# Patient Record
Sex: Male | Born: 1990 | Race: Asian | Hispanic: No | Marital: Single | Smoking: Former smoker
Health system: Southern US, Community
[De-identification: ages and names within clinical notes are randomized; demographics above are authoritative.]

## PROBLEM LIST (undated history)

## (undated) DIAGNOSIS — M26609 Unspecified temporomandibular joint disorder, unspecified side: Secondary | ICD-10-CM

## (undated) DIAGNOSIS — S43491A Other sprain of right shoulder joint, initial encounter: Secondary | ICD-10-CM

## (undated) HISTORY — PX: CHEST WALL BIOPSY: SHX1338

---

## 2013-02-04 ENCOUNTER — Other Ambulatory Visit: Payer: Self-pay | Admitting: Family Medicine

## 2013-02-04 DIAGNOSIS — M25511 Pain in right shoulder: Secondary | ICD-10-CM

## 2013-02-16 ENCOUNTER — Ambulatory Visit
Admission: RE | Admit: 2013-02-16 | Discharge: 2013-02-16 | Disposition: A | Discharge status: Home / Self Care | Payer: Federal, State, Local not specified - PPO | Source: Ambulatory Visit | Attending: Family Medicine | Admitting: Family Medicine

## 2013-02-16 DIAGNOSIS — M25511 Pain in right shoulder: Secondary | ICD-10-CM

## 2013-02-16 MED ORDER — IOHEXOL 180 MG/ML  SOLN
13.0000 mL | Freq: Once | INTRAMUSCULAR | Status: AC | PRN
  Administered 2013-02-16: 13 mL via INTRA_ARTICULAR

## 2013-04-19 ENCOUNTER — Other Ambulatory Visit: Payer: Self-pay | Admitting: Orthopedic Surgery

## 2013-05-16 ENCOUNTER — Encounter (HOSPITAL_BASED_OUTPATIENT_CLINIC_OR_DEPARTMENT_OTHER): Payer: Self-pay | Admitting: *Deleted

## 2013-05-16 DIAGNOSIS — S43431A Superior glenoid labrum lesion of right shoulder, initial encounter: Secondary | ICD-10-CM

## 2013-05-16 DIAGNOSIS — S43491A Other sprain of right shoulder joint, initial encounter: Secondary | ICD-10-CM

## 2013-05-16 HISTORY — DX: Other sprain of right shoulder joint, initial encounter: S43.491A

## 2013-05-16 HISTORY — DX: Superior glenoid labrum lesion of right shoulder, initial encounter: S43.431A

## 2013-05-23 ENCOUNTER — Encounter (HOSPITAL_BASED_OUTPATIENT_CLINIC_OR_DEPARTMENT_OTHER): Payer: Federal, State, Local not specified - PPO | Admitting: Anesthesiology

## 2013-05-23 ENCOUNTER — Ambulatory Visit (HOSPITAL_BASED_OUTPATIENT_CLINIC_OR_DEPARTMENT_OTHER): Payer: Federal, State, Local not specified - PPO | Admitting: Anesthesiology

## 2013-05-23 ENCOUNTER — Encounter (HOSPITAL_BASED_OUTPATIENT_CLINIC_OR_DEPARTMENT_OTHER): Payer: Self-pay | Admitting: Anesthesiology

## 2013-05-23 ENCOUNTER — Encounter (HOSPITAL_BASED_OUTPATIENT_CLINIC_OR_DEPARTMENT_OTHER)
Admission: RE | Disposition: A | Discharge status: Home / Self Care | Payer: Self-pay | Source: Ambulatory Visit | Attending: Orthopedic Surgery

## 2013-05-23 ENCOUNTER — Ambulatory Visit (HOSPITAL_BASED_OUTPATIENT_CLINIC_OR_DEPARTMENT_OTHER)
Admission: RE | Admit: 2013-05-23 | Discharge: 2013-05-23 | Disposition: A | Discharge status: Home / Self Care | Payer: Federal, State, Local not specified - PPO | Source: Ambulatory Visit | Attending: Orthopedic Surgery | Admitting: Orthopedic Surgery

## 2013-05-23 DIAGNOSIS — S43439A Superior glenoid labrum lesion of unspecified shoulder, initial encounter: Secondary | ICD-10-CM | POA: Insufficient documentation

## 2013-05-23 DIAGNOSIS — M24819 Other specific joint derangements of unspecified shoulder, not elsewhere classified: Secondary | ICD-10-CM | POA: Insufficient documentation

## 2013-05-23 DIAGNOSIS — X58XXXA Exposure to other specified factors, initial encounter: Secondary | ICD-10-CM | POA: Insufficient documentation

## 2013-05-23 DIAGNOSIS — Z881 Allergy status to other antibiotic agents status: Secondary | ICD-10-CM | POA: Insufficient documentation

## 2013-05-23 DIAGNOSIS — Z882 Allergy status to sulfonamides status: Secondary | ICD-10-CM | POA: Insufficient documentation

## 2013-05-23 DIAGNOSIS — S43431D Superior glenoid labrum lesion of right shoulder, subsequent encounter: Secondary | ICD-10-CM

## 2013-05-23 HISTORY — PX: BANKART REPAIR: SHX5173

## 2013-05-23 HISTORY — DX: Other sprain of right shoulder joint, initial encounter: S43.491A

## 2013-05-23 HISTORY — DX: Unspecified temporomandibular joint disorder, unspecified side: M26.609

## 2013-05-23 SURGERY — REPAIR, SHOULDER, ARTHROSCOPIC, BANKART
Anesthesia: General | Laterality: Right

## 2013-05-23 MED ORDER — FENTANYL CITRATE 0.05 MG/ML IJ SOLN
INTRAMUSCULAR | Status: DC | PRN
  Administered 2013-05-23: 100 ug via INTRAVENOUS

## 2013-05-23 MED ORDER — OXYCODONE-ACETAMINOPHEN 5-325 MG PO TABS: 1.0000 | ORAL_TABLET | ORAL | Status: DC | PRN

## 2013-05-23 MED ORDER — ONDANSETRON HCL 4 MG/2ML IJ SOLN
INTRAMUSCULAR | Status: DC | PRN
  Administered 2013-05-23: 4 mg via INTRAVENOUS

## 2013-05-23 MED ORDER — CLINDAMYCIN PHOSPHATE 900 MG/50ML IV SOLN
INTRAVENOUS | Status: DC | PRN
  Administered 2013-05-23: 900 mg via INTRAVENOUS

## 2013-05-23 MED ORDER — MIDAZOLAM HCL 5 MG/5ML IJ SOLN
INTRAMUSCULAR | Status: DC | PRN
  Administered 2013-05-23: 2 mg via INTRAVENOUS

## 2013-05-23 MED ORDER — CEFAZOLIN SODIUM-DEXTROSE 2-3 GM-% IV SOLR
INTRAVENOUS | Status: AC
  Filled 2013-05-23: qty 50

## 2013-05-23 MED ORDER — LACTATED RINGERS IV SOLN
INTRAVENOUS | Status: DC
  Administered 2013-05-23 (×2): via INTRAVENOUS

## 2013-05-23 MED ORDER — MIDAZOLAM HCL 2 MG/2ML IJ SOLN
INTRAMUSCULAR | Status: AC
  Filled 2013-05-23: qty 2

## 2013-05-23 MED ORDER — HYDROMORPHONE HCL PF 1 MG/ML IJ SOLN
INTRAMUSCULAR | Status: AC
  Filled 2013-05-23: qty 1

## 2013-05-23 MED ORDER — FENTANYL CITRATE 0.05 MG/ML IJ SOLN
INTRAMUSCULAR | Status: AC
  Filled 2013-05-23: qty 2

## 2013-05-23 MED ORDER — HYDROMORPHONE HCL PF 1 MG/ML IJ SOLN
0.2500 mg | INTRAMUSCULAR | Status: DC | PRN
  Administered 2013-05-23 (×2): 0.5 mg via INTRAVENOUS

## 2013-05-23 MED ORDER — CLINDAMYCIN PHOSPHATE 900 MG/50ML IV SOLN
INTRAVENOUS | Status: AC
  Filled 2013-05-23: qty 50

## 2013-05-23 MED ORDER — DEXAMETHASONE SODIUM PHOSPHATE 4 MG/ML IJ SOLN
INTRAMUSCULAR | Status: DC | PRN
  Administered 2013-05-23: 10 mg via INTRAVENOUS

## 2013-05-23 MED ORDER — LIDOCAINE HCL (CARDIAC) 20 MG/ML IV SOLN
INTRAVENOUS | Status: DC | PRN
  Administered 2013-05-23: 50 mg via INTRAVENOUS

## 2013-05-23 MED ORDER — PROPOFOL 10 MG/ML IV BOLUS
INTRAVENOUS | Status: DC | PRN
  Administered 2013-05-23: 200 mg via INTRAVENOUS

## 2013-05-23 MED ORDER — FENTANYL CITRATE 0.05 MG/ML IJ SOLN: 50.0000 ug | INTRAMUSCULAR | Status: DC | PRN

## 2013-05-23 MED ORDER — CEFAZOLIN SODIUM-DEXTROSE 2-3 GM-% IV SOLR: 2.0000 g | INTRAVENOUS | Status: DC

## 2013-05-23 MED ORDER — OXYCODONE HCL 5 MG PO TABS: 5.0000 mg | ORAL_TABLET | Freq: Once | ORAL | Status: DC | PRN

## 2013-05-23 MED ORDER — OXYCODONE HCL 5 MG/5ML PO SOLN: 5.0000 mg | Freq: Once | ORAL | Status: DC | PRN

## 2013-05-23 MED ORDER — SODIUM CHLORIDE 0.9 % IR SOLN
Status: DC | PRN
  Administered 2013-05-23: 3000 mL

## 2013-05-23 MED ORDER — FENTANYL CITRATE 0.05 MG/ML IJ SOLN
INTRAMUSCULAR | Status: AC
  Filled 2013-05-23: qty 4

## 2013-05-23 MED ORDER — MIDAZOLAM HCL 2 MG/2ML IJ SOLN: 1.0000 mg | INTRAMUSCULAR | Status: DC | PRN

## 2013-05-23 MED ORDER — MORPHINE SULFATE 10 MG/ML IJ SOLN
INTRAMUSCULAR | Status: DC | PRN
  Administered 2013-05-23 (×2): 2 mg via INTRAVENOUS
  Administered 2013-05-23: 3 mg via INTRAVENOUS

## 2013-05-23 MED ORDER — POVIDONE-IODINE 7.5 % EX SOLN: Freq: Once | CUTANEOUS | Status: DC

## 2013-05-23 MED ORDER — SUCCINYLCHOLINE CHLORIDE 20 MG/ML IJ SOLN
INTRAMUSCULAR | Status: DC | PRN
  Administered 2013-05-23: 100 mg via INTRAVENOUS

## 2013-05-23 MED ORDER — MIDAZOLAM HCL 2 MG/ML PO SYRP: 12.0000 mg | ORAL_SOLUTION | Freq: Once | ORAL | Status: DC | PRN

## 2013-05-23 MED ORDER — DOCUSATE SODIUM 100 MG PO CAPS: 100.0000 mg | ORAL_CAPSULE | Freq: Three times a day (TID) | ORAL | Status: DC | PRN

## 2013-05-23 MED ORDER — MORPHINE SULFATE 10 MG/ML IJ SOLN
INTRAMUSCULAR | Status: AC
  Filled 2013-05-23: qty 1

## 2013-05-23 SURGICAL SUPPLY — 83 items
ANCHOR SUT BIOCOMP LK 2.9X12.5 (Anchor) ×4 IMPLANT
BENZOIN TINCTURE PRP APPL 2/3 (GAUZE/BANDAGES/DRESSINGS) IMPLANT
BLADE SURG 15 STRL LF DISP TIS (BLADE) IMPLANT
BLADE SURG 15 STRL SS (BLADE)
BLADE SURG ROTATE 9660 (MISCELLANEOUS) IMPLANT
BLADE VORTEX 6.0 (BLADE) IMPLANT
BUR 3.5 LG SPHERICAL (BURR) IMPLANT
BUR OVAL 4.0 (BURR) IMPLANT
BURR 3.5 LG SPHERICAL (BURR)
CANISTER SUCT 3000ML (MISCELLANEOUS) IMPLANT
CANISTER SUCT LVC 12 LTR MEDI- (MISCELLANEOUS) ×2 IMPLANT
CANNULA 5.75X71 LONG (CANNULA) ×2 IMPLANT
CANNULA TWIST IN 8.25X7CM (CANNULA) ×2 IMPLANT
CHLORAPREP W/TINT 26ML (MISCELLANEOUS) ×2 IMPLANT
DECANTER SPIKE VIAL GLASS SM (MISCELLANEOUS) IMPLANT
DERMABOND ADVANCED (GAUZE/BANDAGES/DRESSINGS)
DERMABOND ADVANCED .7 DNX12 (GAUZE/BANDAGES/DRESSINGS) IMPLANT
DRAPE INCISE IOBAN 66X45 STRL (DRAPES) ×2 IMPLANT
DRAPE STERI 35X30 U-POUCH (DRAPES) ×2 IMPLANT
DRAPE SURG 17X23 STRL (DRAPES) ×2 IMPLANT
DRAPE U 20/CS (DRAPES) ×2 IMPLANT
DRAPE U-SHAPE 47X51 STRL (DRAPES) ×2 IMPLANT
DRAPE U-SHAPE 76X120 STRL (DRAPES) ×4 IMPLANT
ELECT REM PT RETURN 9FT ADLT (ELECTROSURGICAL)
ELECTRODE REM PT RTRN 9FT ADLT (ELECTROSURGICAL) IMPLANT
GAUZE SPONGE 4X4 16PLY XRAY LF (GAUZE/BANDAGES/DRESSINGS) IMPLANT
GAUZE XEROFORM 1X8 LF (GAUZE/BANDAGES/DRESSINGS) ×2 IMPLANT
GLOVE BIO SURGEON STRL SZ7 (GLOVE) ×2 IMPLANT
GLOVE BIO SURGEON STRL SZ7.5 (GLOVE) ×4 IMPLANT
GLOVE BIOGEL PI IND STRL 7.0 (GLOVE) ×2 IMPLANT
GLOVE BIOGEL PI IND STRL 8 (GLOVE) ×2 IMPLANT
GLOVE BIOGEL PI INDICATOR 7.0 (GLOVE) ×2
GLOVE BIOGEL PI INDICATOR 8 (GLOVE) ×2
GLOVE ECLIPSE 6.5 STRL STRAW (GLOVE) ×2 IMPLANT
GOWN PREVENTION PLUS XLARGE (GOWN DISPOSABLE) ×2 IMPLANT
KIT PUSHLOCK 2.9 HIP (KITS) ×2 IMPLANT
LASSO 90 CVE QUICKPAS (DISPOSABLE) ×2 IMPLANT
NDL SUT 6 .5 CRC .975X.05 MAYO (NEEDLE) IMPLANT
NEEDLE 1/2 CIR CATGUT .05X1.09 (NEEDLE) IMPLANT
NEEDLE MAYO TAPER (NEEDLE)
NS IRRIG 1000ML POUR BTL (IV SOLUTION) IMPLANT
PACK ARTHROSCOPY DSU (CUSTOM PROCEDURE TRAY) ×2 IMPLANT
PACK BASIN DAY SURGERY FS (CUSTOM PROCEDURE TRAY) ×2 IMPLANT
PAD ABD 8X10 STRL (GAUZE/BANDAGES/DRESSINGS) ×2 IMPLANT
PENCIL BUTTON HOLSTER BLD 10FT (ELECTRODE) IMPLANT
RESECTOR FULL RADIUS 4.2MM (BLADE) ×2 IMPLANT
SHEET MEDIUM DRAPE 40X70 STRL (DRAPES) IMPLANT
SLEEVE SCD COMPRESS KNEE MED (MISCELLANEOUS) ×2 IMPLANT
SLING ARM FOAM STRAP LRG (SOFTGOODS) IMPLANT
SLING ARM FOAM STRAP MED (SOFTGOODS) IMPLANT
SLING ARM FOAM STRAP XLG (SOFTGOODS) IMPLANT
SLING ARM IMMOBILIZER LRG (SOFTGOODS) ×2 IMPLANT
SLING ARM IMMOBILIZER MED (SOFTGOODS) IMPLANT
SPONGE GAUZE 4X4 12PLY (GAUZE/BANDAGES/DRESSINGS) ×2 IMPLANT
SPONGE LAP 4X18 X RAY DECT (DISPOSABLE) IMPLANT
STRIP CLOSURE SKIN 1/2X4 (GAUZE/BANDAGES/DRESSINGS) IMPLANT
SUCTION FRAZIER TIP 10 FR DISP (SUCTIONS) IMPLANT
SUPPORT WRAP ARM LG (MISCELLANEOUS) IMPLANT
SUT BONE WAX W31G (SUTURE) IMPLANT
SUT ETHIBOND 2 OS 4 DA (SUTURE) IMPLANT
SUT ETHILON 3 0 PS 1 (SUTURE) ×2 IMPLANT
SUT ETHILON 4 0 PS 2 18 (SUTURE) IMPLANT
SUT FIBERWIRE #2 38 T-5 BLUE (SUTURE)
SUT FIBERWIRE 2-0 18 17.9 3/8 (SUTURE)
SUT MNCRL AB 3-0 PS2 18 (SUTURE) IMPLANT
SUT MNCRL AB 4-0 PS2 18 (SUTURE) IMPLANT
SUT PDS AB 0 CT 36 (SUTURE) IMPLANT
SUT PROLENE 3 0 PS 2 (SUTURE) IMPLANT
SUT VIC AB 0 CT1 27 (SUTURE)
SUT VIC AB 0 CT1 27XBRD ANBCTR (SUTURE) IMPLANT
SUT VIC AB 2-0 SH 27 (SUTURE)
SUT VIC AB 2-0 SH 27XBRD (SUTURE) IMPLANT
SUTURE FIBERWR #2 38 T-5 BLUE (SUTURE) IMPLANT
SUTURE FIBERWR 2-0 18 17.9 3/8 (SUTURE) IMPLANT
SYR BULB 3OZ (MISCELLANEOUS) IMPLANT
TAPE LABRALWHITE 1.5X36 (TAPE) ×4 IMPLANT
TOWEL OR 17X24 6PK STRL BLUE (TOWEL DISPOSABLE) ×2 IMPLANT
TOWEL OR NON WOVEN STRL DISP B (DISPOSABLE) ×2 IMPLANT
TUBE CONNECTING 20X1/4 (TUBING) ×2 IMPLANT
TUBING ARTHROSCOPY IRRIG 16FT (MISCELLANEOUS) ×2 IMPLANT
WAND STAR VAC 90 (SURGICAL WAND) IMPLANT
WATER STERILE IRR 1000ML POUR (IV SOLUTION) ×2 IMPLANT
YANKAUER SUCT BULB TIP NO VENT (SUCTIONS) IMPLANT

## 2013-05-23 NOTE — Anesthesia Procedure Notes (Signed)
Procedure Name: Intubation Date/Time: 05/23/2013 11:53 AM Performed by: Caren Macadam Pre-anesthesia Checklist: Patient identified, Emergency Drugs available, Suction available and Patient being monitored Patient Re-evaluated:Patient Re-evaluated prior to inductionOxygen Delivery Method: Circle System Utilized Preoxygenation: Pre-oxygenation with 100% oxygen Intubation Type: IV induction Ventilation: Mask ventilation without difficulty Laryngoscope Size: Miller and 2 Grade View: Grade I Tube type: Oral Tube size: 8.0 mm Number of attempts: 1 Airway Equipment and Method: stylet and oral airway Placement Confirmation: ETT inserted through vocal cords under direct vision,  positive ETCO2 and breath sounds checked- equal and bilateral Secured at: 22 cm Tube secured with: Tape Dental Injury: Teeth and Oropharynx as per pre-operative assessment

## 2013-05-23 NOTE — H&P (Signed)
Adam Curtis is an 22 y.o. male.   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 Clicking jaw syndrome     Past Surgical History  Procedure Laterality Date  . Chest wall biopsy      as a child    History reviewed. No pertinent family history. Social History:  reports that he has never smoked. He has never used smokeless tobacco. He reports that he does not drink alcohol or use illicit drugs.  Allergies:  Allergies  Allergen Reactions  . Amoxicillin Other (See Comments)    UNKNOWN - WAS AS A CHILD  . Sulfa Antibiotics Other (See Comments)    UNKNOWN - WAS AS A CHILD    Medications Prior to Admission  Medication Sig Dispense Refill  . cetirizine (ZYRTEC) 10 MG tablet Take 10 mg by mouth daily.        Results for orders placed during the hospital encounter of 05/23/13 (from the past 48 hour(s))  POCT HEMOGLOBIN-HEMACUE     Status: None   Collection Time    05/23/13 10:38 AM      Result Value Range   Hemoglobin 15.4  13.0 - 17.0 g/dL   No results found.  Review of Systems  All other systems reviewed and are negative.    Blood pressure 148/87, pulse 65, temperature 97.9 F (36.6 C), temperature source Oral, resp. rate 18, height 6\' 1"  (1.854 m), weight 88.905 kg (196 lb), SpO2 65.00%. Physical Exam  Constitutional: He is oriented to person, place, and time. He appears well-developed and well-nourished.  HENT:  Head: Atraumatic.  Eyes: EOM are normal.  Cardiovascular: Intact distal pulses.   Respiratory: Effort normal.  Musculoskeletal:       Right shoulder: He exhibits normal strength.  R shoulder positive anterior apprehension.  Neurological: He is alert and oriented to person, place, and time.  Skin: Skin is warm and dry.  Psychiatric: He has a normal mood and affect.      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

## 2013-05-23 NOTE — Anesthesia Postprocedure Evaluation (Signed)
  Anesthesia Post-op Note  Patient: Adam Curtis  Procedure(s) Performed: Procedure(s) with comments: ARTHROSCOPIC BANKART REPAIR (Right) - Rigth shoulder bankart repair with debridement of chondral injury  Patient Location: PACU  Anesthesia Type:General  Level of Consciousness: awake and alert   Airway and Oxygen Therapy: Patient Spontanous Breathing  Post-op Pain: mild  Post-op Assessment: Post-op Vital signs reviewed, Patient's Cardiovascular Status Stable and Respiratory Function Stable  Post-op Vital Signs: Reviewed  Filed Vitals:   05/23/13 1345  BP:   Pulse: 66  Temp:   Resp: 15    Complications: No apparent anesthesia complications

## 2013-05-23 NOTE — Op Note (Signed)
Procedure(s): ARTHROSCOPIC BANKART REPAIR Procedure Note  Adam Curtis male 22 y.o. 05/23/2013  Procedure(s) and Anesthesia Type:    * Right shoulder ARTHROSCOPIC BANKART REPAIR - General  Surgeon(s) and Role:    * Mable Paris, MD - Primary     Surgeon: Mable Paris   Assistants: Damita Lack PA-C [facility] was present and scrubbed throughout the procedure and was essential in positioning, assisting with the camera and instrumentation,, and closure)  Anesthesia: General endotracheal anesthesia  ASA Class: 1    Procedure Detail  Right shoulder ARTHROSCOPIC BANKART REPAIR  Estimated Blood Loss: Min         Drains: none  Blood Given: none         Specimens: none        Complications:  * No complications entered in OR log *         Disposition: PACU - hemodynamically stable.         Condition: stable    Procedure:   INDICATIONS FOR SURGERY: The patient is 22 y.o. 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 He has had multiple subluxation/dislocation event and has had difficulty with daily activities secondary to his continued instability. He had an MRI arthrogram which revealed anterior-inferior labral tear. Ultimately he was indicated for surgical treatment to restore stability to the shoulder.  OPERATIVE FINDINGS: Examination under anesthesia: 2+ anterior instability. No posterior or sulcus. No limitation of motion. Diagnostic Arthroscopy:  Glenoid articular cartilage: The glenoid labrum was torn from about the 3:00 to 6:00 position anteriorly. This completely detached from the glenoid and inferiorly and medially displaced. There is some adjacent elevation of the cartilage which was debrided. No significant cartilage or bone loss. Humeral head articular cartilage: He had a small area of Hill-Sachs impaction injury posteriorly. Bursal sided rotator cuff: Intact   DESCRIPTION OF PROCEDURE: The patient was identified in  preoperative  holding area where I personally marked the operative site after  verifying site, side, and procedure with the patient. An interscalene block was given by the attending anesthesiologist the holding area.  The patient was taken back to the operating room where general anesthesia was induced without complication and was placed in the beach-chair position with the back  elevated about 60 degrees and all extremities and head and neck carefully padded and  positioned.   The right upper extremity was then prepped and  draped in a standard sterile fashion. The appropriate time-out  procedure was carried out. The patient did receive IV antibiotics  within 30 minutes of incision.   A small posterior portal incision was made and the arthroscope was introduced into the joint. An anterior portal was then established above the subscapularis using needle localization. Large cannula was placed anteriorly. Diagnostic arthroscopy was then carried out with findings as described above.  The posterior superior labrum intact. Rotator cuff was intact. No loose bodies were noted. No glenoid bone loss was noted. The anterior labrum was torn from the 3:00 to 6:00 position completely detached from the glenoid.  Needle localization was used to establish a high lateral rotator interval portal and the camera was moved to this position. Small cannula was then placed posteriorly for suture management. Viewing from the anterior lateral portal an arthroscopic elevator was used to free up scar tissue and mobilize the torn labrum 3:00 to 6:00 position. A rasp was used on the glenoid and labral side to promote healing. Once it was adequately mobilized a 90 hook suture passer was used  to pass a labral tape around the labrum and inferior glenohumeral ligament inferiorly. This was then brought to a 2.9 mm push lock anchor and advanced laterally and superiorly on the glenoid. This restored the glenoid labrum inferiorly and  restore the appropriate tension of the inferior glenohumeral ligament. An additional labral tape was passed superior to the first and again advanced laterally and superiorly to repair the remainder of the tear back to the anterior glenoid with another push lock anchor. Viewing from anterolateral and posterior portals this was felt to completely and adequately repair the labral tear back to its anatomic position.  The arthroscopic equipment was removed from the joint and the portals were closed with 3-0 nylon in an interrupted fashion. Sterile dressings were then applied including Xeroform 4 x 4's ABDs and tape. The patient was then allowed to awaken from general anesthesia, placed in a sling, transferred to the stretcher and taken to the recovery room in stable condition.   POSTOPERATIVE PLAN: The patient will be discharged home today and will followup in one week for suture removal and wound check.  He will follow the standard labral repair protocol.

## 2013-05-23 NOTE — Anesthesia Preprocedure Evaluation (Addendum)
Anesthesia Evaluation  Patient identified by MRN, date of birth, ID band Patient awake    Reviewed: Allergy & Precautions, H&P , NPO status , Patient's Chart, lab work & pertinent test results  Airway Mallampati: I TM Distance: >3 FB Neck ROM: Full    Dental no notable dental hx. (+) Teeth Intact and Dental Advisory Given   Pulmonary neg pulmonary ROS,  breath sounds clear to auscultation  Pulmonary exam normal       Cardiovascular negative cardio ROS  Rhythm:Regular Rate:Normal     Neuro/Psych negative neurological ROS  negative psych ROS   GI/Hepatic negative GI ROS, Neg liver ROS,   Endo/Other  negative endocrine ROS  Renal/GU negative Renal ROS  negative genitourinary   Musculoskeletal   Abdominal   Peds  Hematology negative hematology ROS (+)   Anesthesia Other Findings   Reproductive/Obstetrics negative OB ROS                          Anesthesia Physical Anesthesia Plan  ASA: I  Anesthesia Plan: General   Post-op Pain Management:    Induction: Intravenous  Airway Management Planned: Oral ETT  Additional Equipment:   Intra-op Plan:   Post-operative Plan: Extubation in OR  Informed Consent: I have reviewed the patients History and Physical, chart, labs and discussed the procedure including the risks, benefits and alternatives for the proposed anesthesia with the patient or authorized representative who has indicated his/her understanding and acceptance.   Dental advisory given  Plan Discussed with: CRNA and Surgeon  Anesthesia Plan Comments: (Pt. declines block.)       Anesthesia Quick Evaluation

## 2013-05-23 NOTE — Transfer of Care (Signed)
Immediate Anesthesia Transfer of Care Note  Patient: Adam Curtis  Procedure(s) Performed: Procedure(s) with comments: ARTHROSCOPIC BANKART REPAIR (Right) - Rigth shoulder bankart repair with debridement of chondral injury  Patient Location: PACU  Anesthesia Type:General  Level of Consciousness: awake  Airway & Oxygen Therapy: Patient Spontanous Breathing and Patient connected to face mask oxygen  Post-op Assessment: Report given to PACU RN and Post -op Vital signs reviewed and stable  Post vital signs: Reviewed and stable  Complications: No apparent anesthesia complications

## 2013-05-25 ENCOUNTER — Encounter (HOSPITAL_BASED_OUTPATIENT_CLINIC_OR_DEPARTMENT_OTHER): Payer: Self-pay | Admitting: Orthopedic Surgery

## 2014-05-12 IMAGING — RF DG FLUORO GUIDE NDL PLC/BX
3 series · 3 of 3 positions shown · IV contrast (multihance)
Comparison: none

CLINICAL DATA: Right shoulder pain

RIGHT SHOULDER INJECTION UNDER FLUOROSCOPY
TECHNIQUE: An appropriate skin entrance site was determined.  The
site was marked, prepped with Betadine, draped in the usual sterile
fashion, and infiltrated locally with buffered Lidocaine.  22 gauge
spinal needle was advanced to the superomedial margin of the
humeral head under intermittent fluoroscopy.  1 ml of Lidocaine
injected easily.  A mixture of 0.05 ml Multihance 10 ml of
Omnipaque 300 was then used to opacify the right shoulder capsule.
No immediate complication.
Fluoroscopy Time: 39 seconds.

[Series 2: (hospital) · 1 of 1 slices shown (1 of 3)]
[im 1/1]
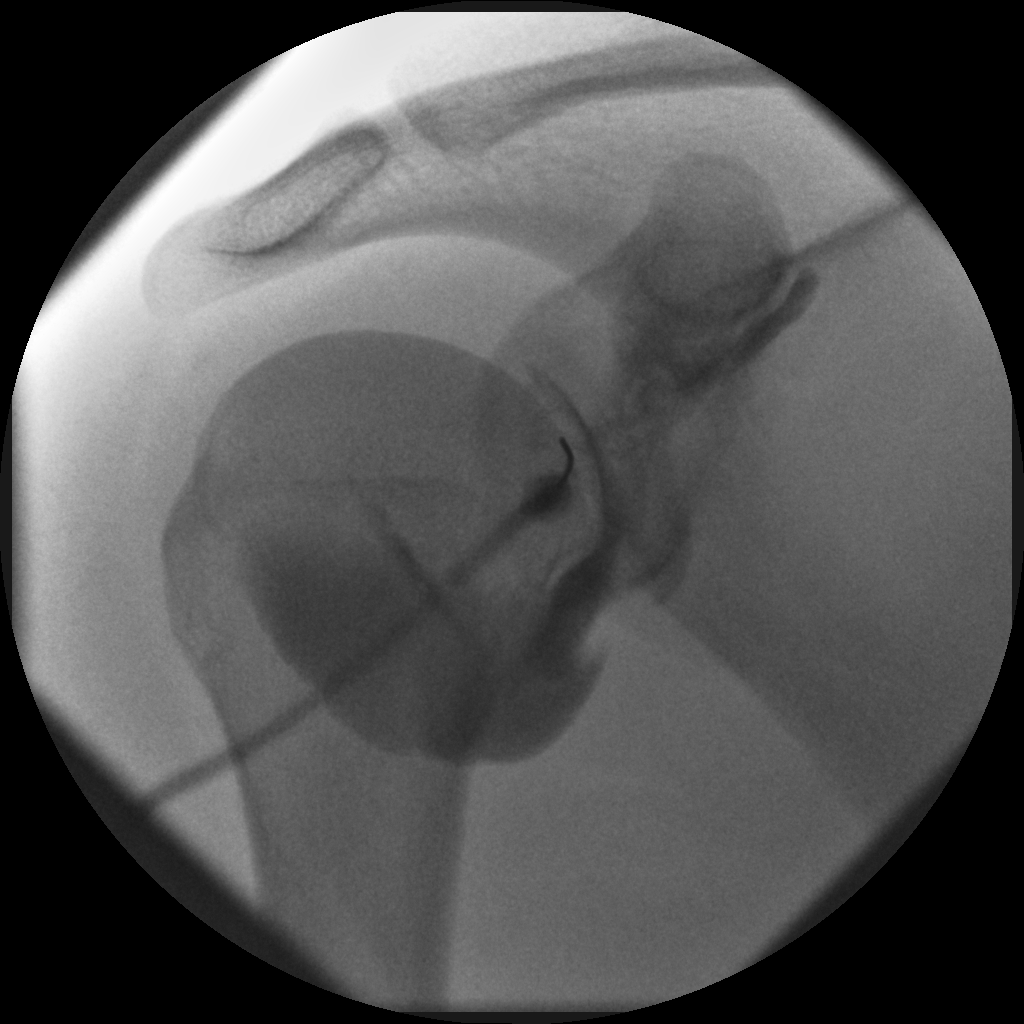

[Series 3: (hospital) · 1 of 1 slices shown (2 of 3)]
[im 1/1]
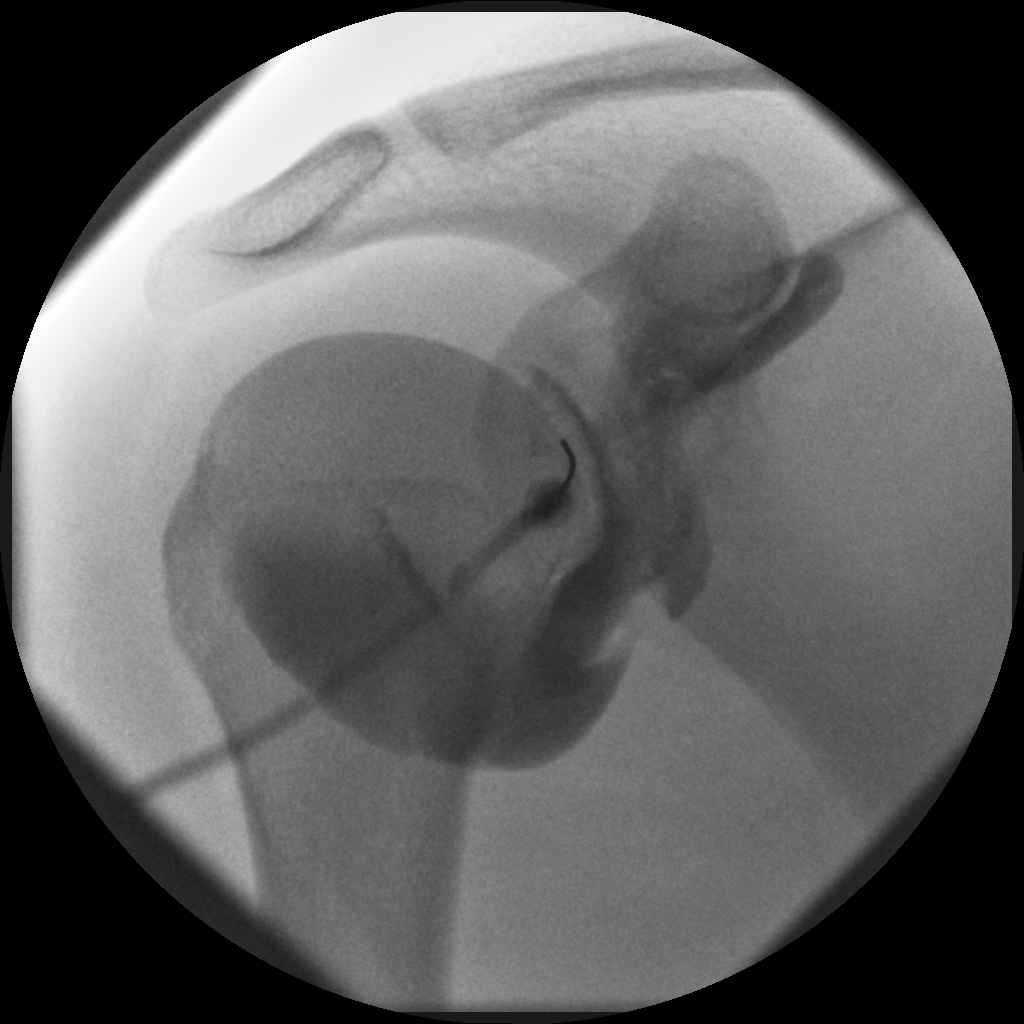

[Series 4: (hospital) · 1 of 1 slices shown (3 of 3)]
[im 1/1]
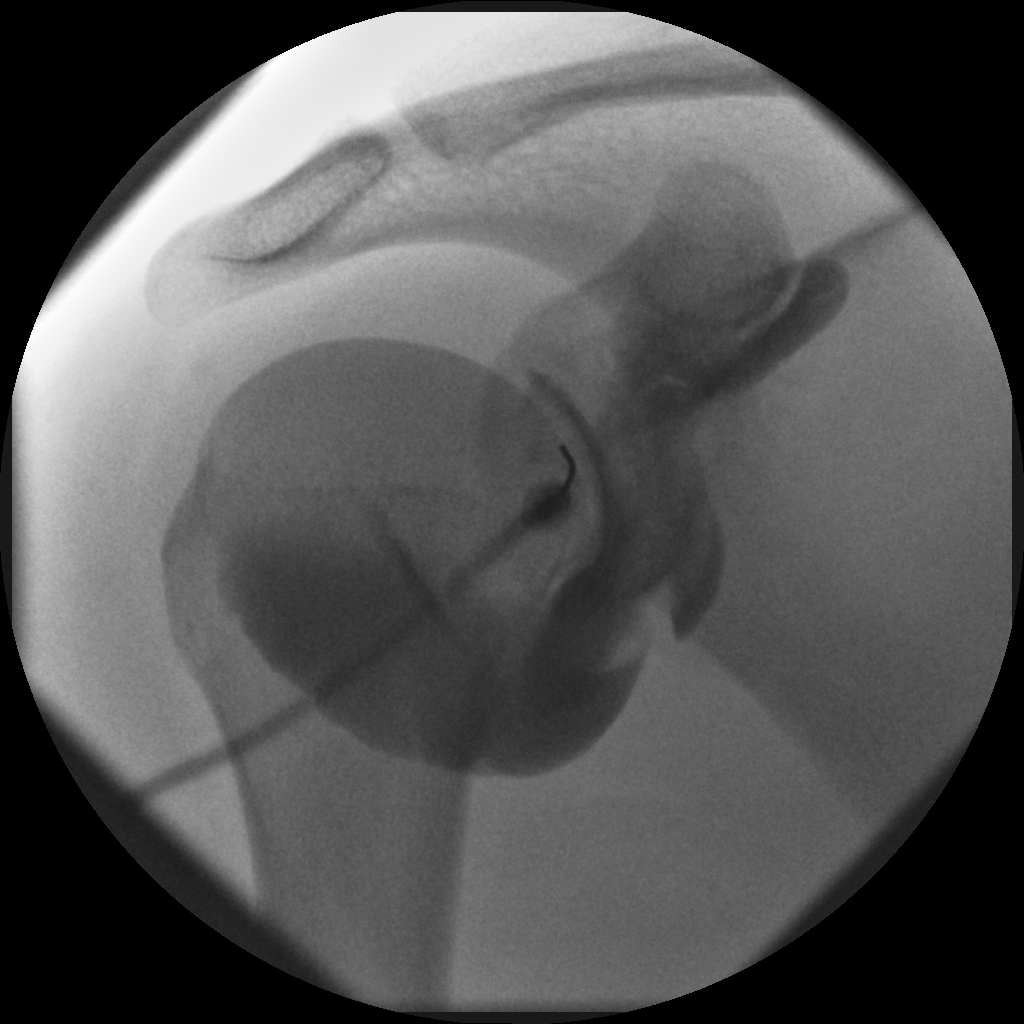

[3 of 3 positions shown; findings below may reference images not displayed]

IMPRESSION: Technically successful right shoulder injection for MRI.

## 2015-10-30 ENCOUNTER — Other Ambulatory Visit: Payer: Self-pay | Admitting: Orthopedic Surgery

## 2015-10-31 ENCOUNTER — Encounter (HOSPITAL_BASED_OUTPATIENT_CLINIC_OR_DEPARTMENT_OTHER): Payer: Self-pay | Admitting: *Deleted

## 2015-11-05 ENCOUNTER — Ambulatory Visit (HOSPITAL_BASED_OUTPATIENT_CLINIC_OR_DEPARTMENT_OTHER)
Admission: RE | Admit: 2015-11-05 | Discharge: 2015-11-05 | Disposition: A | Discharge status: Home / Self Care | Payer: Federal, State, Local not specified - PPO | Source: Ambulatory Visit | Attending: Orthopedic Surgery | Admitting: Orthopedic Surgery

## 2015-11-05 ENCOUNTER — Ambulatory Visit (HOSPITAL_BASED_OUTPATIENT_CLINIC_OR_DEPARTMENT_OTHER): Payer: Federal, State, Local not specified - PPO | Admitting: Anesthesiology

## 2015-11-05 ENCOUNTER — Encounter (HOSPITAL_BASED_OUTPATIENT_CLINIC_OR_DEPARTMENT_OTHER): Payer: Self-pay | Admitting: Anesthesiology

## 2015-11-05 ENCOUNTER — Encounter (HOSPITAL_BASED_OUTPATIENT_CLINIC_OR_DEPARTMENT_OTHER)
Admission: RE | Disposition: A | Discharge status: Home / Self Care | Payer: Self-pay | Source: Ambulatory Visit | Attending: Orthopedic Surgery

## 2015-11-05 DIAGNOSIS — M278 Other specified diseases of jaws: Secondary | ICD-10-CM | POA: Diagnosis not present

## 2015-11-05 DIAGNOSIS — M25312 Other instability, left shoulder: Secondary | ICD-10-CM | POA: Diagnosis not present

## 2015-11-05 DIAGNOSIS — M25512 Pain in left shoulder: Secondary | ICD-10-CM | POA: Diagnosis present

## 2015-11-05 DIAGNOSIS — Z79899 Other long term (current) drug therapy: Secondary | ICD-10-CM | POA: Insufficient documentation

## 2015-11-05 DIAGNOSIS — M24012 Loose body in left shoulder: Secondary | ICD-10-CM | POA: Insufficient documentation

## 2015-11-05 DIAGNOSIS — Z880 Allergy status to penicillin: Secondary | ICD-10-CM | POA: Diagnosis not present

## 2015-11-05 DIAGNOSIS — Z882 Allergy status to sulfonamides status: Secondary | ICD-10-CM | POA: Diagnosis not present

## 2015-11-05 DIAGNOSIS — Z87891 Personal history of nicotine dependence: Secondary | ICD-10-CM | POA: Diagnosis not present

## 2015-11-05 HISTORY — PX: SHOULDER ARTHROSCOPY WITH BANKART REPAIR: SHX5673

## 2015-11-05 SURGERY — SHOULDER ARTHROSCOPY WITH BANKART REPAIR
Anesthesia: General | Site: Shoulder | Laterality: Left

## 2015-11-05 MED ORDER — GLYCOPYRROLATE 0.2 MG/ML IJ SOLN: 0.2000 mg | Freq: Once | INTRAMUSCULAR | Status: DC | PRN

## 2015-11-05 MED ORDER — DEXAMETHASONE SODIUM PHOSPHATE 4 MG/ML IJ SOLN
INTRAMUSCULAR | Status: DC | PRN
  Administered 2015-11-05: 10 mg via INTRAVENOUS

## 2015-11-05 MED ORDER — DOCUSATE SODIUM 100 MG PO CAPS: 100.0000 mg | ORAL_CAPSULE | Freq: Three times a day (TID) | ORAL | Status: AC | PRN

## 2015-11-05 MED ORDER — BUPIVACAINE HCL (PF) 0.25 % IJ SOLN
INTRAMUSCULAR | Status: AC
  Filled 2015-11-05: qty 30

## 2015-11-05 MED ORDER — MIDAZOLAM HCL 2 MG/2ML IJ SOLN
INTRAMUSCULAR | Status: AC
  Filled 2015-11-05: qty 2

## 2015-11-05 MED ORDER — BUPIVACAINE HCL (PF) 0.25 % IJ SOLN
INTRAMUSCULAR | Status: DC | PRN
  Administered 2015-11-05: 20 mL via INTRA_ARTICULAR

## 2015-11-05 MED ORDER — DEXAMETHASONE SODIUM PHOSPHATE 10 MG/ML IJ SOLN
INTRAMUSCULAR | Status: AC
  Filled 2015-11-05: qty 1

## 2015-11-05 MED ORDER — SUCCINYLCHOLINE CHLORIDE 20 MG/ML IJ SOLN
INTRAMUSCULAR | Status: DC | PRN
  Administered 2015-11-05: 100 mg via INTRAVENOUS

## 2015-11-05 MED ORDER — ONDANSETRON HCL 4 MG/2ML IJ SOLN
INTRAMUSCULAR | Status: AC
  Filled 2015-11-05: qty 2

## 2015-11-05 MED ORDER — POVIDONE-IODINE 7.5 % EX SOLN: Freq: Once | CUTANEOUS | Status: DC

## 2015-11-05 MED ORDER — ACETAMINOPHEN 500 MG PO TABS
1000.0000 mg | ORAL_TABLET | Freq: Once | ORAL | Status: AC
  Administered 2015-11-05: 1000 mg via ORAL

## 2015-11-05 MED ORDER — SCOPOLAMINE 1 MG/3DAYS TD PT72: 1.0000 | MEDICATED_PATCH | Freq: Once | TRANSDERMAL | Status: DC | PRN

## 2015-11-05 MED ORDER — HYDROMORPHONE HCL 1 MG/ML IJ SOLN
INTRAMUSCULAR | Status: AC
  Filled 2015-11-05: qty 1

## 2015-11-05 MED ORDER — SODIUM CHLORIDE 0.9 % IR SOLN
Status: DC | PRN
Start: 2015-11-05 — End: 2015-11-05
  Administered 2015-11-05: 5500 mL

## 2015-11-05 MED ORDER — MIDAZOLAM HCL 2 MG/2ML IJ SOLN
1.0000 mg | INTRAMUSCULAR | Status: DC | PRN
  Administered 2015-11-05: 2 mg via INTRAVENOUS

## 2015-11-05 MED ORDER — ACETAMINOPHEN 500 MG PO TABS
ORAL_TABLET | ORAL | Status: AC
  Filled 2015-11-05: qty 2

## 2015-11-05 MED ORDER — LACTATED RINGERS IV SOLN
INTRAVENOUS | Status: DC
  Administered 2015-11-05 (×2): via INTRAVENOUS

## 2015-11-05 MED ORDER — OXYCODONE-ACETAMINOPHEN 5-325 MG PO TABS: 1.0000 | ORAL_TABLET | ORAL | Status: AC | PRN

## 2015-11-05 MED ORDER — FENTANYL CITRATE (PF) 100 MCG/2ML IJ SOLN
50.0000 ug | INTRAMUSCULAR | Status: AC | PRN
  Administered 2015-11-05: 25 ug via INTRAVENOUS
  Administered 2015-11-05: 50 ug via INTRAVENOUS
  Administered 2015-11-05: 25 ug via INTRAVENOUS
  Administered 2015-11-05: 100 ug via INTRAVENOUS

## 2015-11-05 MED ORDER — FENTANYL CITRATE (PF) 100 MCG/2ML IJ SOLN
INTRAMUSCULAR | Status: AC
  Filled 2015-11-05: qty 4

## 2015-11-05 MED ORDER — HYDROMORPHONE HCL 1 MG/ML IJ SOLN
0.2500 mg | INTRAMUSCULAR | Status: DC | PRN
  Administered 2015-11-05 (×2): 0.5 mg via INTRAVENOUS

## 2015-11-05 MED ORDER — KETOROLAC TROMETHAMINE 30 MG/ML IJ SOLN
INTRAMUSCULAR | Status: DC | PRN
  Administered 2015-11-05: 30 mg via INTRAVENOUS

## 2015-11-05 MED ORDER — PROPOFOL 10 MG/ML IV BOLUS
INTRAVENOUS | Status: DC | PRN
  Administered 2015-11-05: 60 mg via INTRAVENOUS
  Administered 2015-11-05: 200 mg via INTRAVENOUS

## 2015-11-05 MED ORDER — VANCOMYCIN HCL IN DEXTROSE 1-5 GM/200ML-% IV SOLN
INTRAVENOUS | Status: AC
  Filled 2015-11-05: qty 200

## 2015-11-05 MED ORDER — LIDOCAINE HCL 4 % EX SOLN
CUTANEOUS | Status: DC | PRN
  Administered 2015-11-05: 3 mL via TOPICAL

## 2015-11-05 MED ORDER — VANCOMYCIN HCL IN DEXTROSE 1-5 GM/200ML-% IV SOLN
1000.0000 mg | INTRAVENOUS | Status: AC
  Administered 2015-11-05: 1000 mg via INTRAVENOUS

## 2015-11-05 MED ORDER — ONDANSETRON HCL 4 MG/2ML IJ SOLN
INTRAMUSCULAR | Status: DC | PRN
  Administered 2015-11-05: 4 mg via INTRAVENOUS

## 2015-11-05 SURGICAL SUPPLY — 63 items
ANCHOR SUT BIOCOMP LK 2.9X12.5 (Anchor) ×12 IMPLANT
BUR 3.5 LG SPHERICAL (BURR) IMPLANT
BUR OVAL 4.0 (BURR) IMPLANT
BURR 3.5 LG SPHERICAL (BURR)
BURR 3.5MM LG SPHERICAL (BURR)
CANNULA 5.75X71 LONG (CANNULA) ×4 IMPLANT
CANNULA TWIST IN 8.25X7CM (CANNULA) ×4 IMPLANT
CHLORAPREP W/TINT 26ML (MISCELLANEOUS) ×4 IMPLANT
DECANTER SPIKE VIAL GLASS SM (MISCELLANEOUS) ×4 IMPLANT
DRAPE IMP U-DRAPE 54X76 (DRAPES) ×4 IMPLANT
DRAPE INCISE IOBAN 66X45 STRL (DRAPES) ×4 IMPLANT
DRAPE STERI 35X30 U-POUCH (DRAPES) ×4 IMPLANT
DRAPE SURG 17X23 STRL (DRAPES) ×4 IMPLANT
DRAPE U-SHAPE 47X51 STRL (DRAPES) ×4 IMPLANT
DRAPE U-SHAPE 76X120 STRL (DRAPES) ×8 IMPLANT
DRSG PAD ABDOMINAL 8X10 ST (GAUZE/BANDAGES/DRESSINGS) IMPLANT
ELECT REM PT RETURN 9FT ADLT (ELECTROSURGICAL)
ELECTRODE REM PT RTRN 9FT ADLT (ELECTROSURGICAL) IMPLANT
FIBERSTICK 2 (SUTURE) IMPLANT
GAUZE SPONGE 4X4 12PLY STRL (GAUZE/BANDAGES/DRESSINGS) ×4 IMPLANT
GAUZE SPONGE 4X4 16PLY XRAY LF (GAUZE/BANDAGES/DRESSINGS) IMPLANT
GAUZE XEROFORM 1X8 LF (GAUZE/BANDAGES/DRESSINGS) ×4 IMPLANT
GLOVE BIO SURGEON STRL SZ7 (GLOVE) ×4 IMPLANT
GLOVE BIO SURGEON STRL SZ7.5 (GLOVE) ×4 IMPLANT
GLOVE BIOGEL PI IND STRL 7.0 (GLOVE) ×2 IMPLANT
GLOVE BIOGEL PI IND STRL 8 (GLOVE) ×2 IMPLANT
GLOVE BIOGEL PI INDICATOR 7.0 (GLOVE) ×2
GLOVE BIOGEL PI INDICATOR 8 (GLOVE) ×2
GLOVE ECLIPSE 6.5 STRL STRAW (GLOVE) ×8 IMPLANT
GOWN STRL REUS W/ TWL LRG LVL3 (GOWN DISPOSABLE) ×4 IMPLANT
GOWN STRL REUS W/ TWL XL LVL3 (GOWN DISPOSABLE) ×2 IMPLANT
GOWN STRL REUS W/TWL LRG LVL3 (GOWN DISPOSABLE) ×4
GOWN STRL REUS W/TWL XL LVL3 (GOWN DISPOSABLE) ×2
IV NS IRRIG 3000ML ARTHROMATIC (IV SOLUTION) ×8 IMPLANT
KIT BIO-SUTURETAK 2.4 SPR TROC (KITS) IMPLANT
KIT PUSHLOCK 2.9 HIP (KITS) ×4 IMPLANT
LASSO 90 CVE QUICKPAS (DISPOSABLE) ×4 IMPLANT
LASSO CRESCENT QUICKPASS (SUTURE) IMPLANT
MANIFOLD NEPTUNE II (INSTRUMENTS) ×4 IMPLANT
NS IRRIG 1000ML POUR BTL (IV SOLUTION) IMPLANT
PACK ARTHROSCOPY DSU (CUSTOM PROCEDURE TRAY) ×4 IMPLANT
PACK BASIN DAY SURGERY FS (CUSTOM PROCEDURE TRAY) ×4 IMPLANT
RESECTOR FULL RADIUS 4.2MM (BLADE) ×4 IMPLANT
SHEET MEDIUM DRAPE 40X70 STRL (DRAPES) IMPLANT
SLEEVE SCD COMPRESS KNEE MED (MISCELLANEOUS) ×4 IMPLANT
SLING ARM FOAM STRAP LRG (SOFTGOODS) IMPLANT
SLING ARM IMMOBILIZER MED (SOFTGOODS) IMPLANT
SLING ARM IMMOBILIZER XL (CAST SUPPLIES) ×4 IMPLANT
SLING ARM MED ADULT FOAM STRAP (SOFTGOODS) IMPLANT
SLING ARM XL FOAM STRAP (SOFTGOODS) IMPLANT
SUPPORT WRAP ARM LG (MISCELLANEOUS) IMPLANT
SUT ETHILON 3 0 PS 1 (SUTURE) ×4 IMPLANT
SUT ETHILON 4 0 PS 2 18 (SUTURE) IMPLANT
SUT MNCRL AB 3-0 PS2 18 (SUTURE) IMPLANT
TAPE LABRALWHITE 1.5X36 (TAPE) ×4 IMPLANT
TAPE SUT LABRALTAP WHT/BLK (SUTURE) ×4 IMPLANT
TOWEL OR 17X24 6PK STRL BLUE (TOWEL DISPOSABLE) ×4 IMPLANT
TOWEL OR NON WOVEN STRL DISP B (DISPOSABLE) ×4 IMPLANT
TUBE CONNECTING 20'X1/4 (TUBING)
TUBE CONNECTING 20X1/4 (TUBING) IMPLANT
TUBING ARTHROSCOPY IRRIG 16FT (MISCELLANEOUS) ×4 IMPLANT
WAND STAR VAC 90 (SURGICAL WAND) IMPLANT
WATER STERILE IRR 1000ML POUR (IV SOLUTION) ×4 IMPLANT

## 2015-11-05 NOTE — Transfer of Care (Signed)
Immediate Anesthesia Transfer of Care Note  Patient: Neill Thorstenson  Procedure(s) Performed: Procedure(s) with comments: ARTHROSCOPY SHOULDER BANKART REPAIR, LOOSE BODY REMOVAL (Left) - ARTHROSCOPY LEFT SHOULDER BANKART REPAIR, LOOSE BODY REMOVAL  Patient Location: PACU  Anesthesia Type:General  Level of Consciousness: sedated  Airway & Oxygen Therapy: Patient Spontanous Breathing and Patient connected to face mask oxygen  Post-op Assessment: Report given to RN and Post -op Vital signs reviewed and stable  Post vital signs: Reviewed and stable  Last Vitals:  Filed Vitals:   11/05/15 1004 11/05/15 1335  BP: 140/81   Pulse: 72 65  Temp: 36.7 C   Resp: 18 26    Last Pain:  Filed Vitals:   11/05/15 1336  PainSc: 1       Patients Stated Pain Goal: 1 (11/05/15 1004)  Complications: No apparent anesthesia complications

## 2015-11-05 NOTE — Discharge Instructions (Signed)
Discharge Instructions after Arthroscopic Shoulder Repair ° ° °A sling has been provided for you. Remain in your sling at all times. This includes sleeping in your sling.  °Use ice on the shoulder intermittently over the first 48 hours after surgery.  °Pain medicine has been prescribed for you.  °Use your medicine liberally over the first 48 hours, and then you can begin to taper your use. You may take Extra Strength Tylenol or Tylenol only in place of the pain pills. DO NOT take ANY nonsteroidal anti-inflammatory pain medications: Advil, Motrin, Ibuprofen, Aleve, Naproxen, or Narprosyn.  °You may remove your dressing after two days. If the incision sites are still moist, place a Band-Aid over the moist site(s). Change Band-Aids daily until dry.  °You may shower 5 days after surgery. The incisions CANNOT get wet prior to 5 days. Simply allow the water to wash over the site and then pat dry. Do not rub the incisions. Make sure your axilla (armpit) is completely dry after showering.  °Take one aspirin a day for 2 weeks after surgery, unless you have an aspirin sensitivity/ allergy or asthma. ° ° °Please call [phone] during normal business hours or [phone] after hours for any problems. Including the following: ° °- excessive redness of the incisions °- drainage for more than 4 days °- fever of more than 101.5 F ° °*Please note that pain medications will not be refilled after hours or on weekends. ° ° ° ° °Post Anesthesia Home Care Instructions ° °Activity: °Get plenty of rest for the remainder of the day. A responsible adult should stay with you for 24 hours following the procedure.  °For the next 24 hours, DO NOT: °-Drive a car °-Operate machinery °-Drink alcoholic beverages °-Take any medication unless instructed by your physician °-Make any legal decisions or sign important papers. ° °Meals: °Start with liquid foods such as gelatin or soup. Progress to regular foods as tolerated. Avoid greasy, spicy, heavy  foods. If nausea and/or vomiting occur, drink only clear liquids until the nausea and/or vomiting subsides. Call your physician if vomiting continues. ° °Special Instructions/Symptoms: °Your throat may feel dry or sore from the anesthesia or the breathing tube placed in your throat during surgery. If this causes discomfort, gargle with warm salt water. The discomfort should disappear within 24 hours. ° °If you had a scopolamine patch placed behind your ear for the management of post- operative nausea and/or vomiting: ° °1. The medication in the patch is effective for 72 hours, after which it should be removed.  Wrap patch in a tissue and discard in the trash. Wash hands thoroughly with soap and water. °2. You may remove the patch earlier than 72 hours if you experience unpleasant side effects which may include dry mouth, dizziness or visual disturbances. °3. Avoid touching the patch. Wash your hands with soap and water after contact with the patch. °  ° °

## 2015-11-05 NOTE — Anesthesia Preprocedure Evaluation (Addendum)
Anesthesia Evaluation  Patient identified by MRN, date of birth, ID band Patient awake    Reviewed: Allergy & Precautions, H&P , NPO status , Patient's Chart, lab work & pertinent test results  Airway Mallampati: I  TM Distance: >3 FB Neck ROM: Full    Dental no notable dental hx. (+) Teeth Intact, Dental Advisory Given   Pulmonary neg pulmonary ROS, former smoker,    Pulmonary exam normal breath sounds clear to auscultation       Cardiovascular negative cardio ROS   Rhythm:Regular Rate:Normal     Neuro/Psych negative neurological ROS  negative psych ROS   GI/Hepatic negative GI ROS, Neg liver ROS,   Endo/Other  negative endocrine ROS  Renal/GU negative Renal ROS  negative genitourinary   Musculoskeletal   Abdominal   Peds  Hematology negative hematology ROS (+)   Anesthesia Other Findings   Reproductive/Obstetrics negative OB ROS                            Anesthesia Physical Anesthesia Plan  ASA: I  Anesthesia Plan: General   Post-op Pain Management:    Induction: Intravenous  Airway Management Planned: Oral ETT  Additional Equipment:   Intra-op Plan:   Post-operative Plan: Extubation in OR  Informed Consent: I have reviewed the patients History and Physical, chart, labs and discussed the procedure including the risks, benefits and alternatives for the proposed anesthesia with the patient or authorized representative who has indicated his/her understanding and acceptance.   Dental advisory given  Plan Discussed with: CRNA  Anesthesia Plan Comments:        Anesthesia Quick Evaluation

## 2015-11-05 NOTE — Op Note (Signed)
Procedure(s): ARTHROSCOPY SHOULDER BANKART REPAIR, LOOSE BODY REMOVAL Procedure Note  Adam Curtis male 25 y.o. 11/05/2015  Procedure(s) and Anesthesia Type:    #1 left shoulder arthroscopic Bankart repair    #2 left shoulder arthroscopic removal of chondral loose body  Surgeon(s) and Role:    * Tania Ade, MD - Primary     Surgeon: Nita Sells   Assistants: Jeanmarie Hubert PA-C (Danielle was present and scrubbed throughout the procedure and was essential in positioning, assisting with the camera and instrumentation,, and closure)  Anesthesia: General endotracheal anesthesia    Procedure Detail  ARTHROSCOPY SHOULDER BANKART REPAIR, LOOSE BODY REMOVAL  Estimated Blood Loss: Min         Drains: none  Blood Given: none         Specimens: none        Complications:  * No complications entered in OR log *         Disposition: PACU - hemodynamically stable.         Condition: stable    Procedure:   INDICATIONS FOR SURGERY: The patient is 25 y.o. male who has had a history of left shoulder instability over the past several years with recent dislocation event playing racquetball. He was found on MRI to have deficient anterior labrum and Hill-Sachs lesion with associated chondral loose body in the axillary pouch. Is indicated for surgical treatment to try and restore stability to the shoulder and remove the chondral loose body. He has had a previous surgery similar on the right side in the past and did well with it.  OPERATIVE FINDINGS: Examination under anesthesia: 2+ anterior instability. No posterior instability. Negative sulcus.  DESCRIPTION OF PROCEDURE: The patient was identified in preoperative  holding area where I personally marked the operative site after  verifying site, side, and procedure with the patient. An interscalene block was given by the attending anesthesiologist the holding area.  The patient was taken back to the operating room  where general anesthesia was induced without complication and was placed in the beach-chair position with the back  elevated about 60 degrees and all extremities and head and neck carefully padded and  positioned.   The left upper extremity was then prepped and  draped in a standard sterile fashion. The appropriate time-out  procedure was carried out. The patient did receive IV antibiotics  within 30 minutes of incision.   A small posterior portal incision was made and the arthroscope was introduced into the joint. An anterior portal was then established above the subscapularis using needle localization. Large cannula was placed anteriorly. Diagnostic arthroscopy was then carried out with findings as described above.  He was noted to have intact superior labrum, biceps tendon, subscapularis, supraspinatus and infraspinatus. The cartilage on the glenoid was noted to have a defect anterior-inferior in the region of the Bankart tear with a small chondral flap which was debrided with a shaver. Cartilage on the humeral head was intact with the exception of the posterior Hill-Sachs defect which was relatively small but also with displaceable chondral flaps which were debrided. In the axillary recess he was noted to have a chondral loose body which measured 15 x 6 mm. This was discarded with a grasper. The anterior-inferior labrum was noted to be deficient. The camera was moved to a high lateral rotator interval portal position with needle localization. Small cannula was placed posteriorly. Viewing from the anterior superior portal the residual labrum and anterior-inferior capsule and ligament were freed up from the  anterior labrum. It appeared that he likely had a radial tear at about the 3:00 position anteriorly with small residual flap superiorly which was debrided. The inferior labrum was mobilized with the inferior glenoid humeral ligament and capsule. Using a grasper I was able to visualize where it could  be reattached advancing it superiorly and anteriorly onto the face slightly. This tensioned IGH L nicely. The initial suture was passed inferiorly and advanced slightly superiorly and anteriorly. When placing the 2.9 bio composite push lock anchor it was initially placed too deeply and the sutures broke away from the anchor which had advanced into the glenoid vault. It was not retrievable. A second anchor was placed over the same sutures and advanced into the tunnel getting good purchase and was not able to be pulled out with manual stress. A second suture was passed superior to the first and advanced again superior and anterior further tensioning the inferior glenohumeral ligament and capsule. This was felt to completely repair the portion of labrum inferiorly that could be reattached. The repair was then viewed from the posterior portal and looked good with recreation of the bumper anteriorly. At this point the arthroscopic equipment was removed from the joint portals closed with 3-0 nylon interrupted fashion in the joint was insufflated with 20 mL quarter percent Marcaine without epinephrine for postoperative pain relief.  The arthroscopic equipment was removed from the joint and the portals were closed with 3-0 nylon in an interrupted fashion. Sterile dressings were then applied including Xeroform 4 x 4's ABDs and tape. The patient was then allowed to awaken from general anesthesia, placed in a sling, transferred to the stretcher and taken to the recovery room in stable condition.   POSTOPERATIVE PLAN: The patient will be discharged home today and will followup in one week for suture removal and wound check.  He will follow the standard labral protocol.

## 2015-11-05 NOTE — Anesthesia Procedure Notes (Signed)
Procedure Name: Intubation Date/Time: 11/05/2015 11:58 AM Performed by: Burna CashONRAD, Angalena Cousineau C Pre-anesthesia Checklist: Patient identified, Emergency Drugs available, Suction available and Patient being monitored Patient Re-evaluated:Patient Re-evaluated prior to inductionOxygen Delivery Method: Circle System Utilized Preoxygenation: Pre-oxygenation with 100% oxygen Intubation Type: IV induction Ventilation: Mask ventilation without difficulty Tube type: Oral Tube size: 8.0 mm Number of attempts: 1 Airway Equipment and Method: Stylet and Oral airway Placement Confirmation: ETT inserted through vocal cords under direct vision,  positive ETCO2 and breath sounds checked- equal and bilateral Secured at: 22 cm Tube secured with: Tape Dental Injury: Teeth and Oropharynx as per pre-operative assessment

## 2015-11-05 NOTE — H&P (Signed)
Adam Curtis is an 25 y.o. male.   Chief Complaint: L shoulder pain / instability HPI: L shoulder pain with recurrent dislocation events.  MRI with labral tear and loose body.  Past Medical History  Diagnosis Date  . 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 Clicking jaw syndrome     Past Surgical History  Procedure Laterality Date  . Chest wall biopsy      as a child  . Bankart repair Right 05/23/2013    Procedure: ARTHROSCOPIC BANKART REPAIR;  Surgeon: Mable ParisJustin William Rosely Fernandez, MD;  Location: [facility];  Service: Orthopedics;  Laterality: Right;  Rigth shoulder bankart repair with debridement of chondral injury    History reviewed. No pertinent family history. Social History:  reports that he quit smoking about 2 years ago. He has never used smokeless tobacco. He reports that he drinks about 1.2 oz of alcohol per week. He reports that he does not use illicit drugs.  Allergies:  Allergies  Allergen Reactions  . Amoxicillin Other (See Comments)    UNKNOWN - WAS AS A CHILD  . Sulfa Antibiotics Other (See Comments)    UNKNOWN - WAS AS A CHILD    Medications Prior to Admission  Medication Sig Dispense Refill  . cetirizine (ZYRTEC) 10 MG tablet Take 10 mg by mouth daily.    Marland Kitchen. docusate sodium (COLACE) 100 MG capsule Take 1 capsule (100 mg total) by mouth 3 (three) times daily as needed. 20 capsule 0  . oxyCODONE-acetaminophen (ROXICET) 5-325 MG per tablet Take 1-2 tablets by mouth every 4 (four) hours as needed for severe pain. 60 tablet 0    No results found for this or any previous visit (from the past 48 hour(s)). No results found.  Review of Systems  All other systems reviewed and are negative.   Blood pressure 140/81, pulse 72, temperature 98.1 F (36.7 C), temperature source Oral, resp. rate 18, height 6\' 1"  (1.854 m), weight 100.018 kg (220 lb 8 oz), SpO2 100 %. Physical Exam  Constitutional: He is oriented to person, place,  and time. He appears well-developed and well-nourished.  HENT:  Head: Atraumatic.  Eyes: EOM are normal.  Cardiovascular: Intact distal pulses.   Respiratory: Effort normal.  Musculoskeletal:  L shoulder + apprehension/pain  Neurological: He is alert and oriented to person, place, and time.  Skin: Skin is warm and dry.  Psychiatric: He has a normal mood and affect.     Assessment/Plan L shoulder pain with recurrent dislocation events.  MRI with labral tear and loose body. Plan L arth labral repair, removal LB Risks / benefits of surgery discussed Consent on chart  NPO for OR Preop antibiotics   Mable ParisHANDLER,Laverta Harnisch WILLIAM, MD 11/05/2015, 11:30 AM

## 2015-11-05 NOTE — Anesthesia Postprocedure Evaluation (Signed)
Anesthesia Post Note  Patient: Estevon Surratt  Procedure(s) Performed: Procedure(s) (LRB): ARTHROSCOPY SHOULDER BANKART REPAIR, LOOSE BODY REMOVAL (Left)  Patient location during evaluation: PACU Anesthesia Type: General Level of consciousness: awake and alert Pain management: pain level controlled Vital Signs Assessment: post-procedure vital signs reviewed and stable Respiratory status: spontaneous breathing, nonlabored ventilation and respiratory function stable Cardiovascular status: blood pressure returned to baseline and stable Postop Assessment: no signs of nausea or vomiting Anesthetic complications: no    Last Vitals:  Filed Vitals:   11/05/15 1345 11/05/15 1400  BP: 117/67 129/93  Pulse: 58 90  Temp:    Resp: 19 16    Last Pain:  Filed Vitals:   11/05/15 1404  PainSc: Asleep                 Nykeem Citro,W. EDMOND

## 2015-11-06 ENCOUNTER — Encounter (HOSPITAL_BASED_OUTPATIENT_CLINIC_OR_DEPARTMENT_OTHER): Payer: Self-pay | Admitting: Orthopedic Surgery
